# Patient Record
Sex: Female | Born: 2000 | Hispanic: Yes | Marital: Single
Health system: Southern US, Community
[De-identification: ages and names within clinical notes are randomized; demographics above are authoritative.]

---
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
## 2022-05-09 ENCOUNTER — Encounter: Payer: Self-pay | Admitting: Emergency Medicine

## 2022-05-09 ENCOUNTER — Emergency Department
Admission: EM | Admit: 2022-05-09 | Discharge: 2022-05-10 | Disposition: A | Discharge status: Home / Self Care | Payer: Medicaid Other | Attending: Emergency Medicine | Admitting: Emergency Medicine

## 2022-05-09 ENCOUNTER — Emergency Department: Payer: Medicaid Other

## 2022-05-09 DIAGNOSIS — R7401 Elevation of levels of liver transaminase levels: Secondary | ICD-10-CM | POA: Insufficient documentation

## 2022-05-09 DIAGNOSIS — K8050 Calculus of bile duct without cholangitis or cholecystitis without obstruction: Secondary | ICD-10-CM | POA: Insufficient documentation

## 2022-05-09 DIAGNOSIS — R1010 Upper abdominal pain, unspecified: Secondary | ICD-10-CM | POA: Diagnosis present

## 2022-05-09 LAB — URINALYSIS, ROUTINE W REFLEX MICROSCOPIC
Bilirubin Urine: NEGATIVE
Glucose, UA: NEGATIVE mg/dL
Hgb urine dipstick: NEGATIVE
Ketones, ur: NEGATIVE mg/dL
Leukocytes,Ua: NEGATIVE
Nitrite: NEGATIVE
Protein, ur: NEGATIVE mg/dL
Specific Gravity, Urine: 1.033 — ABNORMAL HIGH (ref 1.005–1.030)
pH: 5 (ref 5.0–8.0)

## 2022-05-09 LAB — CBC
HCT: 43.3 % (ref 36.0–46.0)
Hemoglobin: 14.1 g/dL (ref 12.0–15.0)
MCH: 28 pg (ref 26.0–34.0)
MCHC: 32.6 g/dL (ref 30.0–36.0)
MCV: 85.9 fL (ref 80.0–100.0)
Platelets: 209 10*3/uL (ref 150–400)
RBC: 5.04 MIL/uL (ref 3.87–5.11)
RDW: 12.8 % (ref 11.5–15.5)
WBC: 9.9 10*3/uL (ref 4.0–10.5)
nRBC: 0 % (ref 0.0–0.2)

## 2022-05-09 LAB — COMPREHENSIVE METABOLIC PANEL
ALT: 63 U/L — ABNORMAL HIGH (ref 0–44)
AST: 128 U/L — ABNORMAL HIGH (ref 15–41)
Albumin: 4.6 g/dL (ref 3.5–5.0)
Alkaline Phosphatase: 122 U/L (ref 38–126)
Anion gap: 7 (ref 5–15)
BUN: 14 mg/dL (ref 6–20)
CO2: 26 mmol/L (ref 22–32)
Calcium: 9.4 mg/dL (ref 8.9–10.3)
Chloride: 109 mmol/L (ref 98–111)
Creatinine, Ser: 0.49 mg/dL (ref 0.44–1.00)
GFR, Estimated: >60 mL/min (ref >60)
Glucose, Bld: 109 mg/dL — ABNORMAL HIGH (ref 70–99)
Potassium: 3.7 mmol/L (ref 3.5–5.1)
Sodium: 142 mmol/L (ref 135–145)
Total Bilirubin: 1 mg/dL (ref 0.3–1.2)
Total Protein: 7.6 g/dL (ref 6.5–8.1)

## 2022-05-09 LAB — LIPASE, BLOOD: Lipase: 35 U/L (ref 11–51)

## 2022-05-09 LAB — POC URINE PREG, ED: Preg Test, Ur: NEGATIVE

## 2022-05-09 NOTE — ED Triage Notes (Signed)
Pt presents via POV with complaints of epigastric pain with associated N/V. Hx of GERD - she notes taking the medication without improvement in her sx. Denies CP or SOB.

## 2022-05-10 MED ORDER — MORPHINE SULFATE (PF) 4 MG/ML IV SOLN
4.0000 mg | Freq: Once | INTRAVENOUS | Status: AC
  Administered 2022-05-10: 4 mg via INTRAVENOUS
  Filled 2022-05-10: qty 1

## 2022-05-10 MED ORDER — HYDROCODONE-ACETAMINOPHEN 5-325 MG PO TABS: 1.0000 | ORAL_TABLET | ORAL | 0 refills | Status: AC | PRN

## 2022-05-10 MED ORDER — PANTOPRAZOLE SODIUM 40 MG PO TBEC: 40.0000 mg | DELAYED_RELEASE_TABLET | Freq: Every day | ORAL | 1 refills | Status: AC

## 2022-05-10 MED ORDER — ONDANSETRON HCL 4 MG/2ML IJ SOLN
4.0000 mg | Freq: Once | INTRAMUSCULAR | Status: AC
  Administered 2022-05-10: 4 mg via INTRAVENOUS
  Filled 2022-05-10: qty 2

## 2022-05-10 MED ORDER — SODIUM CHLORIDE 0.9 % IV BOLUS
1000.0000 mL | Freq: Once | INTRAVENOUS | Status: AC
  Administered 2022-05-10: 1000 mL via INTRAVENOUS

## 2022-05-10 NOTE — ED Provider Notes (Incomplete)
[facility] Provider Note    Event Date/Time   First MD Initiated Contact with Patient 05/09/22 2357     (approximate)  History   Chief Complaint: Abdominal Pain  HPI  Raven Stewart is a 21 y.o. female with no past medical history who presents to the emergency department for upper abdominal discomfort.  According to the patient over the last few months she has intermittently been experiencing pain in the upper abdomen she describes it more as a burning type pain.  Patient states that time she will be nauseated and she will vomit and then feels somewhat better.  No diarrhea.  No fever.  Patient states tonight's episode started around 6 PM, she last ate around 4 PM.  Patient has not noted any association with food previously.  Physical Exam   Triage Vital Signs: ED Triage Vitals  Enc Vitals Group     BP 05/09/22 2259 110/72     Pulse Rate 05/09/22 2259 68     Resp 05/09/22 2259 20     Temp 05/09/22 2259 97.7 F (36.5 C)     Temp Source 05/09/22 2259 Oral     SpO2 05/09/22 2259 98 %     Weight 05/09/22 2300 125 lb (56.7 kg)     Height 05/09/22 2300 5\' 3"  (1.6 m)     Head Circumference --      Peak Flow --      Pain Score 05/09/22 2300 6     Pain Loc --      Pain Edu? --      Excl. in GC? --     Most recent vital signs: Vitals:   05/09/22 2259  BP: 110/72  Pulse: 68  Resp: 20  Temp: 97.7 F (36.5 C)  SpO2: 98%    General: Awake, no distress.  CV:  Good peripheral perfusion.  Regular rate and rhythm  Resp:  Normal effort.  Equal breath sounds bilaterally.  Abd:  Soft, mild to moderate epigastric tenderness with mild right upper quadrant tenderness.  No rebound or guarding.    ED Results / Procedures / Treatments   RADIOLOGY  I have reviewed the ultrasound images.  Patient appears to have significant shadowing consistent with likely gallstones but no obvious pericholecystic fluid on my interpretation.   Radiology has read the  ultrasound as cholelithiasis with no signs of acute cholecystitis.   MEDICATIONS ORDERED IN ED: Medications - No data to display   IMPRESSION / MDM / ASSESSMENT AND PLAN / ED COURSE  I reviewed the triage vital signs and the nursing notes.  Patient's presentation is most consistent with acute presentation with potential threat to life or bodily function.  Patient presents emergency department for upper abdominal pain starting around 6 PM tonight last ate around 4 PM.  Patient states this similar pain has been an intermittent ongoing issue over the past few months.  Patient states it is coming sporadically and not necessarily associated with food.  Patient denies any fever.  No urinary symptoms.  States she will become nauseated and vomit on occasion but denies any diarrhea.  Patient is on birth control unknown last menstrual period.  Patient's work-up today shows a normal CBC, reassuring urine with a negative pregnancy test.  Patient's chemistry does show elevated AST and ALT with a normal lipase.  Differential would include gastritis, cholecystitis, biliary colic, choledocholithiasis less likely given normal T. bili and lipase.  We will obtain a right upper quadrant ultrasound  to further evaluate.  Patient agreeable to plan of care.  Ultrasound consistent with cholelithiasis but no signs of pericholecystic fluid or other signs of acute cholecystitis.  We will treat the patient's discomfort and reassess.  Continues to 8/10 pain.  Nauseated just had another episode of vomiting.  We will treat pain nausea IV hydrate and reassess.  Patient is now pain-free, tolerated p.o. trial without issue  We will discharge with short course of pain medication, Protonix have the patient follow-up with surgery.  I discussed return precautions.  Patient agreeable.  FINAL CLINICAL IMPRESSION(S) / ED DIAGNOSES   Upper abdominal pain Biliary colic  Note:  This document was prepared using Dragon voice recognition  software and may include unintentional dictation errors.    Minna Antis, MD 05/10/22 [phone]

## 2022-05-10 NOTE — Discharge Instructions (Signed)
Please call the number provided for general surgery to arrange a follow-up appointment for this coming week.  Please take your medications as needed, as prescribed.  Please avoid fatty/oily foods.  Return to the emergency department for any return of pain, any fever, or any other symptom personally concerning to yourself.
